# Patient Record
Sex: Female | Born: 1995 | Race: White | Hispanic: No | Marital: Single | Smoking: Never smoker
Health system: Southern US, Community
[De-identification: ages and names within clinical notes are randomized; demographics above are authoritative.]

## PROBLEM LIST (undated history)

## (undated) DIAGNOSIS — Q6589 Other specified congenital deformities of hip: Secondary | ICD-10-CM

## (undated) DIAGNOSIS — R197 Diarrhea, unspecified: Secondary | ICD-10-CM

## (undated) DIAGNOSIS — E739 Lactose intolerance, unspecified: Secondary | ICD-10-CM

## (undated) DIAGNOSIS — K219 Gastro-esophageal reflux disease without esophagitis: Secondary | ICD-10-CM

## (undated) DIAGNOSIS — G44209 Tension-type headache, unspecified, not intractable: Secondary | ICD-10-CM

## (undated) DIAGNOSIS — Z91010 Allergy to peanuts: Secondary | ICD-10-CM

## (undated) HISTORY — DX: Gastro-esophageal reflux disease without esophagitis: K21.9

## (undated) HISTORY — DX: Tension-type headache, unspecified, not intractable: G44.209

## (undated) HISTORY — DX: Lactose intolerance, unspecified: E73.9

## (undated) HISTORY — DX: Allergy to peanuts: Z91.010

## (undated) HISTORY — DX: Diarrhea, unspecified: R19.7

## (undated) HISTORY — DX: Other specified congenital deformities of hip: Q65.89

---
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
## 2014-03-19 DIAGNOSIS — R197 Diarrhea, unspecified: Secondary | ICD-10-CM

## 2014-03-19 HISTORY — DX: Diarrhea, unspecified: R19.7

## 2014-06-09 IMAGING — RF DG CHOLANGIOGRAM OPERATIVE
1 series · 5 of 5 positions shown · non-contrast
Comparison: None.

CLINICAL DATA: Cholecystectomy.

EXAM:
INTRAOPERATIVE CHOLANGIOGRAM
TECHNIQUE: Cholangiographic images from the C-arm fluoroscopic device were
submitted for interpretation post-operatively. Please see the
procedural report for the amount of contrast and the fluoroscopy
time utilized.

[Series 1: run · 2 acquisitions, 5 frames shown]
[im 1/2]
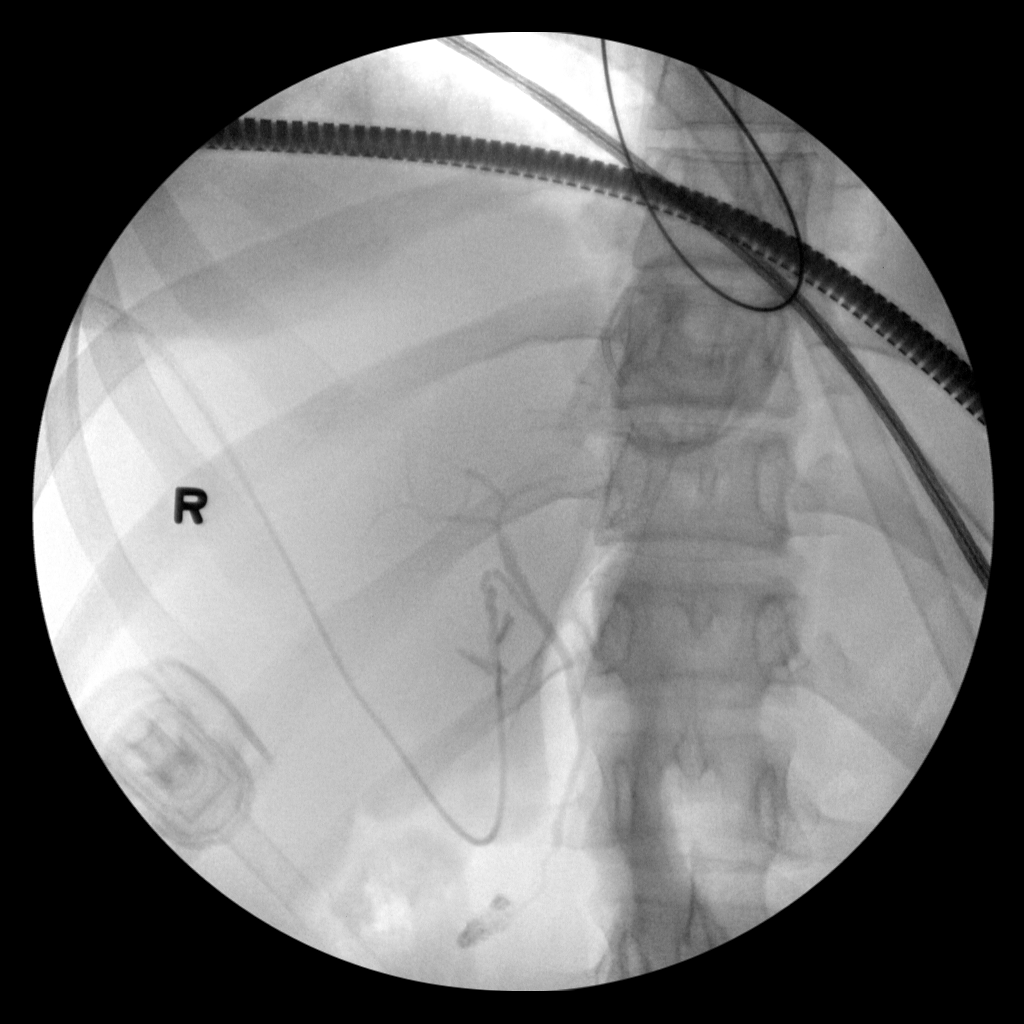
[im 1/2]
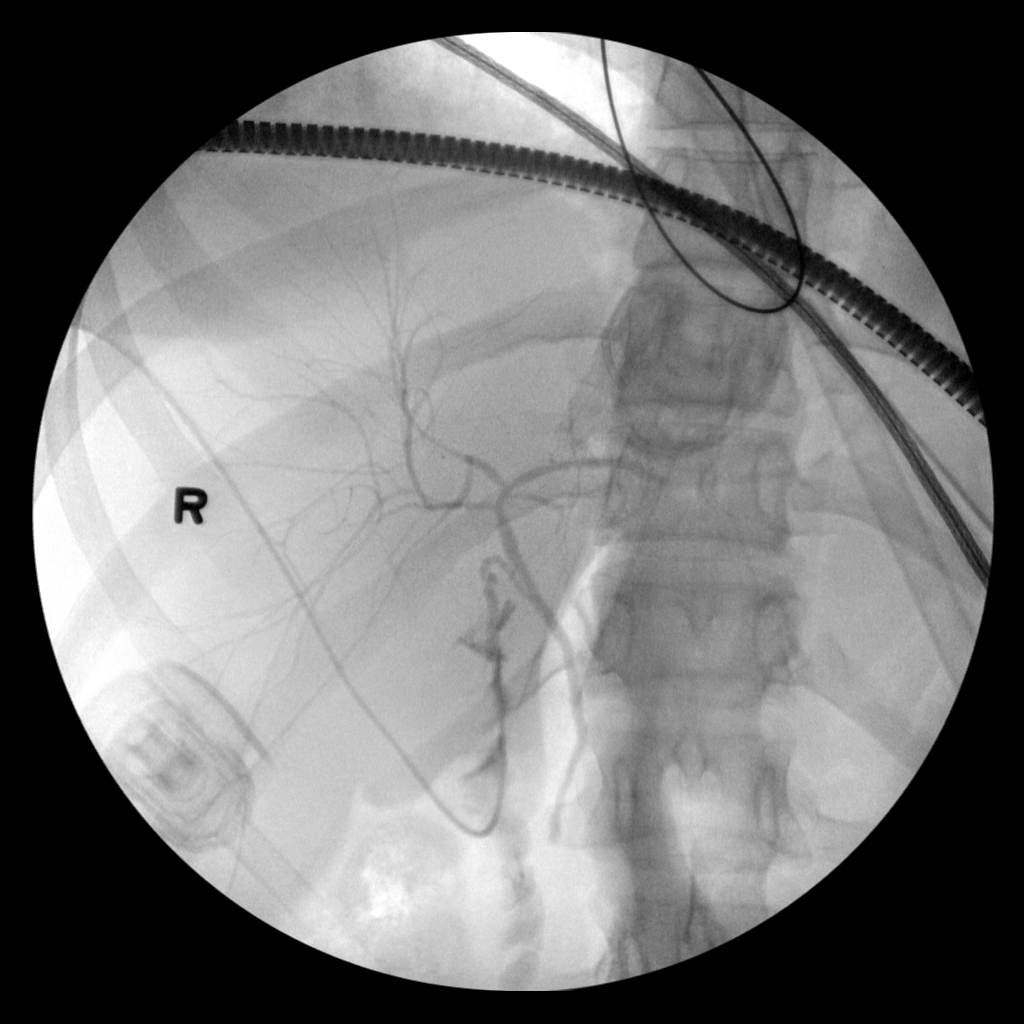
[im 1/2]
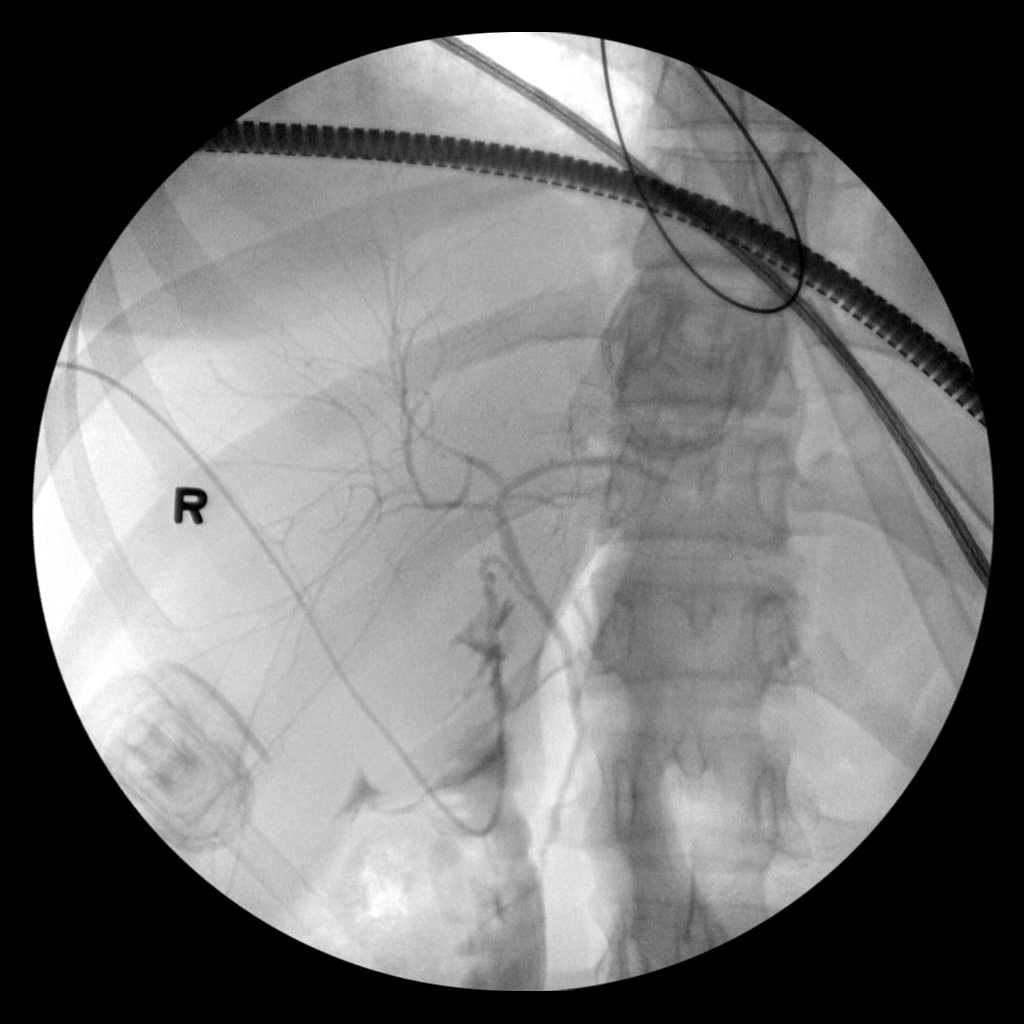
[im 1/2]
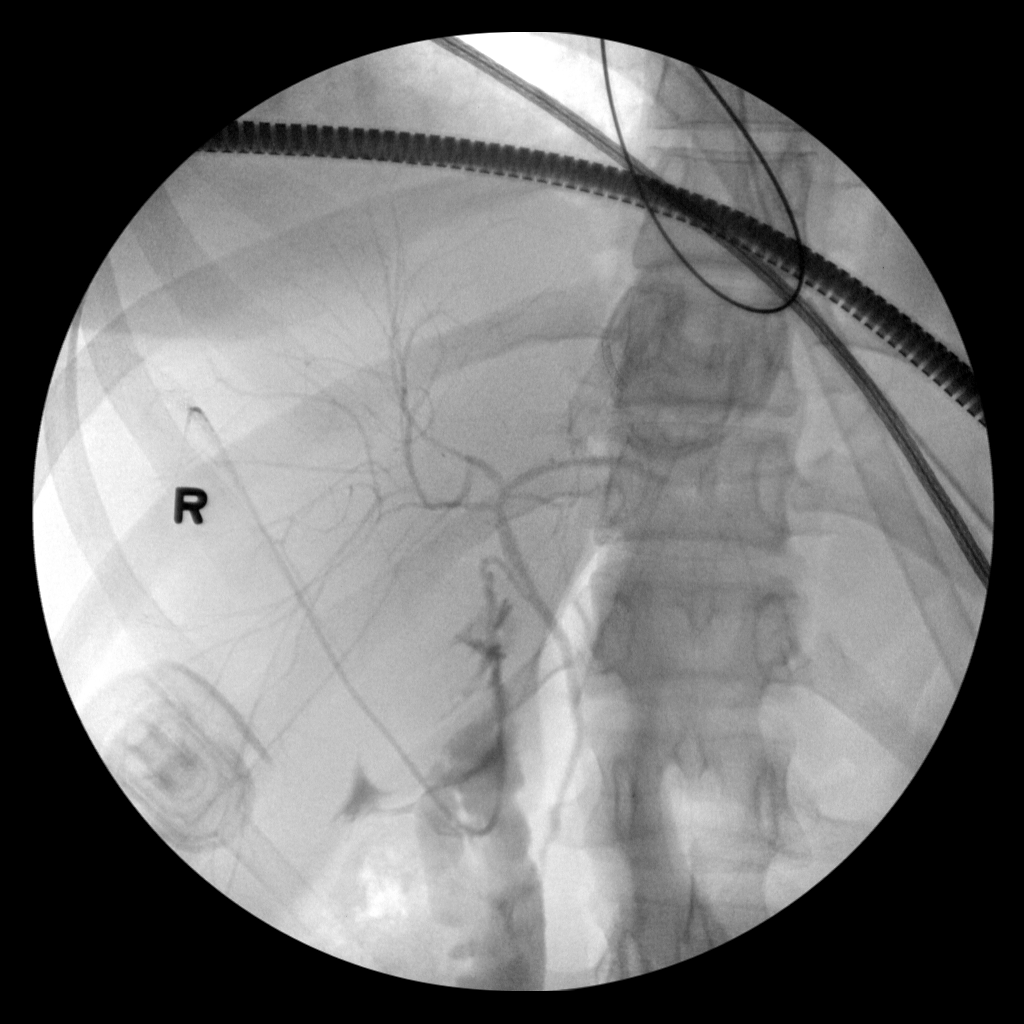
[im 2/2]
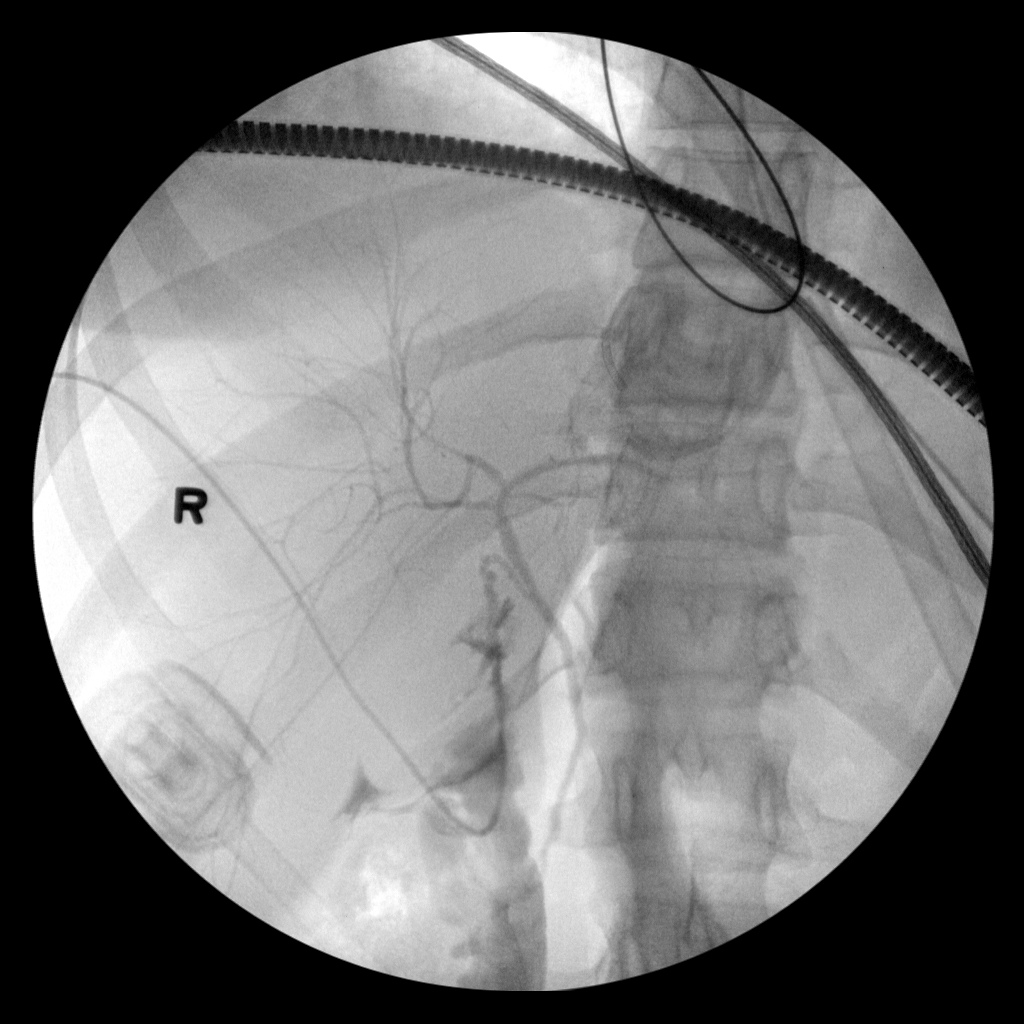

[5 of 5 positions shown; findings below may reference images not displayed]

FINDINGS: The intrahepatic and extrahepatic biliary system is well opacified.
The biliary system is non dilated. No evidence for filling defects
or stones.
IMPRESSION: Normal intraoperative cholangiogram. No evidence for an obstructing
lesion or stone.

## 2014-09-20 ENCOUNTER — Encounter: Payer: Self-pay | Admitting: Family Medicine

## 2014-10-18 ENCOUNTER — Encounter: Payer: Self-pay | Admitting: Family Medicine

## 2014-10-18 NOTE — Progress Notes (Deleted)
OFFICE VISIT  10/18/2014   CC: No chief complaint on file.  HPI:    Patient is a 19 y.o. Caucasian female who presents for ***  Past Medical History  Diagnosis Date  . GERD (gastroesophageal reflux disease)     Makes dietary adjustments + takes PPI  . Muscle contraction headache syndrome     No signif s/s of migraines  . Lactose intolerance     lactaid causes dry heaves: she avoids dairy  . Hip dysplasia     per old records  . Food allergy, peanut     ? seafood allergy? old records incomplete on this point.  . Family history of anesthesia complication     headache, n&v-mom  . Diarrhea 2016    ?post cholecystectomy syndrome?--her GI MD rx'd colestid 1g qhs 09/2014    Past Surgical History  Procedure Laterality Date  . Myringotomy  1998    tubes- bilateral  . Cholecystectomy N/A 05/26/2013    Procedure: LAPAROSCOPIC CHOLECYSTECTOMY WITH INTRAOPERATIVE CHOLANGIOGRAM;  Surgeon: Clovis Pu. Cornett, MD;  Location: [facility] OR;  Service: General;  Laterality: N/A;    Outpatient Prescriptions Prior to Visit  Medication Sig Dispense Refill  . etonogestrel-ethinyl estradiol (NUVARING) 0.12-0.015 MG/24HR vaginal ring Place 1 each vaginally every 28 (twenty-eight) days. Insert vaginally and leave in place for 3 consecutive weeks, then remove for 1 week.    . pantoprazole (PROTONIX) 40 MG tablet Take 1 tablet (40 mg total) by mouth daily. 30 tablet 3   No facility-administered medications prior to visit.    Allergies  Allergen Reactions  . Lactose Intolerance (Gi)     ROS As per HPI  PE: There were no vitals taken for this visit. ***  LABS:    Chemistry      Component Value Date/Time   NA 138 05/18/2013 0829   K 3.7 05/18/2013 0829   CL 103 05/18/2013 0829   CO2 22 05/18/2013 0829   BUN 10 05/18/2013 0829   CREATININE 0.65 05/18/2013 0829      Component Value Date/Time   CALCIUM 8.8 05/18/2013 0829   ALKPHOS 42 05/18/2013 0829   AST 19 05/18/2013 0829   ALT 26  05/18/2013 0829   BILITOT 0.4 05/18/2013 0829    Glucose 128 on 05/18/13 (???fasting)  Lab Results  Component Value Date   WBC 6.6 05/18/2013   HGB 12.7 05/18/2013   HCT 36.6 05/18/2013   MCV 89.1 05/18/2013   PLT 244 05/18/2013     IMPRESSION AND PLAN:  No problem-specific assessment & plan notes found for this encounter.   FOLLOW UP: No Follow-up on file.

## 2014-12-08 ENCOUNTER — Encounter: Payer: Self-pay | Admitting: Family Medicine

## 2015-03-02 ENCOUNTER — Encounter: Payer: Self-pay | Admitting: Podiatry

## 2015-03-02 ENCOUNTER — Ambulatory Visit (INDEPENDENT_AMBULATORY_CARE_PROVIDER_SITE_OTHER): Payer: BLUE CROSS/BLUE SHIELD | Admitting: Podiatry

## 2015-03-02 VITALS — BP 117/76 | HR 94 | Resp 12

## 2015-03-02 DIAGNOSIS — L600 Ingrowing nail: Secondary | ICD-10-CM | POA: Diagnosis not present

## 2015-03-02 MED ORDER — OXYCODONE-ACETAMINOPHEN 5-325 MG PO TABS: 1.0000 | ORAL_TABLET | ORAL | Status: DC | PRN

## 2015-03-02 NOTE — Patient Instructions (Signed)
Today after multiple attempts of local anesthetics, your toe still was uncomfortable and surgery was deferred I prescribed Percocet for pain control and I want you to take one Percocet one hour prior to years that schedule visit and do not drive We will again attempt local anesthesia

## 2015-03-02 NOTE — Progress Notes (Signed)
   Subjective:    Patient ID: Stacey Glass, female    DOB: 1995-05-10, 19 y.o.   MRN: [id number]  HPI   Patient presents today complaining of painful hallux toenails left  more painful than the right. Patient describes long history (multiple years) of discomfort in the margins of the toenails and has been able to trim the nails out until the past 2 days when the left hallux toenail became extremely uncomfortable. Patient denies any previous infection or with additional care for this problem.  Review of Systems  Skin: Positive for color change.       Objective:   Physical Exam  Pleasant orientated 3 patient is extremely anxious at presentation today and throughout the entire visit  Objective: Vascular: No peripheral edema bilaterally DP and PT pulses 2/4 bilaterally Capillary reflex immediate bilaterally  Neurological: Sensation to 10 g monofilament wire intact 5/5 bilaterally Vibratory sensation intact bilaterally Ankle reflex equal and reactive bilaterally  Dermatological: Texture and turgor within normal limits bilaterally The medial and lateral borders of the right and left hallux nails are incurvated Local erythema and edema along the medial lateral borders of the left hallux nail. There is no pus noted in the left hallux nail  Musculoskeletal: No deformities noted       Assessment & Plan:   Assessment: Satisfactory neurovascular status Ingrowing medial lateral borders of the left and right hallux nails Low-grade onychia left hallux nail  Plan: I discussed treatment options with patient today including permanent nail removal. Patient did verbally consent to local anesthetic and permanent nail removal of the medial lateral borders of the left hallux toenail. The left hallux was initially block with 3 mL of 50-50 mixture of 2% plain Xylocaine and 0.5% plain Marcaine. After approximately 15-20 minutes patient still was not numb and additional 2 mL oriented more  distal to the initial block utilizing the same mixture. Approximately 20 minutes later the patient still stated that she was not numb. The medial and lateral nail folds were infiltrated through a distal approach using an  additional 1/2 cc of the same solution.  After waiting approximately  20 minutes the patient patient still stated that she was not numb and could not tolerate the procedure.  At this time I recommended to patient that we defer on surgery and give patient a loading dose of Percocet 5/325 one hour prior to next visit. Patient early consented to this  Also, I offered patient antibiotics to treat a low-grade onychia and left hallux nail patient declined antibiotics  Rx Percocet 5/325 #8 take 1 every 4 hours as needed for pain On the after visit summary I wrote and struck to patient to take one Percocet one hour prior to next scheduled visit and do not drive  Reappoint at patient's request for repeat phenol matricectomy the medial lateral borders the left hallux nail

## 2015-03-08 ENCOUNTER — Ambulatory Visit (INDEPENDENT_AMBULATORY_CARE_PROVIDER_SITE_OTHER): Payer: BLUE CROSS/BLUE SHIELD | Admitting: Podiatry

## 2015-03-08 ENCOUNTER — Encounter: Payer: Self-pay | Admitting: Podiatry

## 2015-03-08 DIAGNOSIS — L600 Ingrowing nail: Secondary | ICD-10-CM

## 2015-03-08 NOTE — Patient Instructions (Signed)
   Today I numbed your toe with Carbocaine which provided complete anesthesia If you need anything for pain control afterwards a rather use over-the-counter ibuprofen Return as needed if you have any future concerns  ANTIBACTERIAL SOAP INSTRUCTIONS  THE DAY AFTER PROCEDURE   For the first dressing change (soak) Place 3-4 drops of antibacterial liquid soap in a quart of warm tap water.  Submerge foot into water for 2 minutes.  If bandage was applied after your procedure, leave on to allow for easy lift off, then remove and continue with soak for the remaining time.  Next, blot area dry with a soft cloth and apply antibiotic ointment such as polysporin, neosporin, or triple antibiotic ointment.  You may then switch to the instructions below    Shower as usual. Before getting out, place a drop of antibacterial liquid soap (Dial) on a wet, clean washcloth.  Gently wipe washcloth over affected area.  Afterward, rinse the area with warm water.  Blot the area dry with a soft cloth and cover with antibiotic ointment (neosporin, polysporin, bacitracin) and band aid or gauze and tape

## 2015-03-08 NOTE — Progress Notes (Signed)
Patient ID: Stacey Glass, female   DOB: December 09, 1995, 19 y.o.   MRN: [id number]  Subjective: This patient presents for scheduled visit for removal of the medial lateral borders: Of the left hallux toenail. On the initial visit of 03/02/2015 anesthesia was not achieved and surgery deferred until today. Patient presents taking one Percocet 5/325 one hour prior to presentation  Objective: Patient appears more relaxed today DP and PT pulses 2/4 bilaterally The medial lateral borders the left hallux toenail incurvated and tender to direct palpation. There is mild edema along the lateral border of the left hallux toenail  Assessment: Ingrowing medial lateral borders of the left hallux toenail  Plan: The left hallux block with 3 mL of 2% Carbocaine. Anesthesia was obtained without any difficulty. The left hallux was painted with Betadine and exsanguinated. The medial lateral borders the left hallux toenail were excised and a phenol matricectomy performed. The wounds were washed with alcohol and an antibiotic compression dressing was applied. Tourniquet was released and spontaneous capillary fill time noted in left hallux. Patient tolerated procedure without any difficulty  Postoperative oral reconstruction provided. Postoperative medication suggested ibuprofen rather than Percocet that was previously prescribed  Reappoint if patient has a future concerns

## 2015-03-24 ENCOUNTER — Ambulatory Visit: Admit: 2015-03-24 | Discharge: 2015-03-24 | Payer: BLUE CROSS/BLUE SHIELD | Attending: Family

## 2015-03-24 DIAGNOSIS — J329 Chronic sinusitis, unspecified: Secondary | ICD-10-CM

## 2015-03-24 NOTE — Patient Instructions (Addendum)
Sinusitis: Care Instructions  Your Care Instructions     Sinusitis is an infection of the lining of the sinus cavities in your head. Sinusitis often follows a cold. It causes pain and pressure in your head and face.  In most cases, sinusitis gets better on its own in 1 to 2 weeks. But some mild symptoms may last for several weeks. Sometimes antibiotics are needed.  Follow-up care is a key part of your treatment and safety. Be sure to make and go to all appointments, and call your doctor if you are having problems. It's also a good idea to know your test results and keep a list of the medicines you take.  How can you care for yourself at home?  ?? Take an over-the-counter pain medicine, such as acetaminophen (Tylenol), ibuprofen (Advil, Motrin), or naproxen (Aleve). Read and follow all instructions on the label.  ?? If the doctor prescribed antibiotics, take them as directed. Do not stop taking them just because you feel better. You need to take the full course of antibiotics.  ?? Be careful when taking over-the-counter cold or flu medicines and Tylenol at the same time. Many of these medicines have acetaminophen, which is Tylenol. Read the labels to make sure that you are not taking more than the recommended dose. Too much acetaminophen (Tylenol) can be harmful.  ?? Breathe warm, moist air from a steamy shower, a hot bath, or a sink filled with hot water. Avoid cold, dry air. Using a humidifier in your home may help. Follow the directions for cleaning the machine.  ?? Use saline (saltwater) nasal washes to help keep your nasal passages open and wash out mucus and bacteria. You can buy saline nose drops at a grocery store or drugstore. Or you can make your own at home by adding 1 teaspoon of salt and 1 teaspoon of baking soda to 2 cups of distilled water. If you make your own, fill a bulb syringe with the solution, insert the tip into your nostril, and squeeze gently. Blow your nose.  ?? Put a hot, wet towel or a warm  gel pack on your face 3 or 4 times a day for 5 to 10 minutes each time.  ?? Try a decongestant nasal spray like oxymetazoline (Afrin). Do not use it for more than 3 days in a row. Using it for more than 3 days can make your congestion worse.  When should you call for help?  Call your doctor now or seek immediate medical care if:  ?? You have new or worse swelling or redness in your face or around your eyes.  ?? You have a new or higher fever.  Watch closely for changes in your health, and be sure to contact your doctor if:  ?? You have new or worse facial pain.  ?? The mucus from your nose becomes thicker (like pus) or has new blood in it.  ?? You are not getting better as expected.  Where can you learn more?  Go to [email or web address] and sign in to your MyChart account. Enter I933 in the Search Health Information box to learn more about ???Sinusitis: Care Instructions.???    If you do not have an account, please click on the ???Sign Up Now??? link.  ?? 2006-2016 [employer]. Care instructions adapted under license by [facility]. This care instruction is for use with your licensed healthcare professional. If you have questions about a medical condition or this instruction, always ask your healthcare professional. [employer],   Incorpor[employer] disclaims any warranty or liability for your use of this information.  Content Version: 11.0.578772; Current as of: February 05, 2014        Saline Nasal Washes: Care Instructions  Your Care Instructions  Saline nasal washes help keep the nasal passages open by washing out thick or dried mucus. This simple remedy can help relieve symptoms of allergies, sinusitis, and colds. It also can make the nose feel more comfortable by keeping the mucous membranes moist. You may notice a little burning sensation in your nose the first few times you use the solution, but this usually gets better in a few days.  Follow-up care is a key part of your treatment and safety. Be  sure to make and go to all appointments, and call your doctor if you are having problems. It's also a good idea to know your test results and keep a list of the medicines you take.  How can you care for yourself at home?  ?? You can buy premixed saline solution in a squeeze bottle or other sinus rinse products at a drugstore. Read and follow the instructions on the label.  ?? You also can make your own saline solution by adding 1 teaspoon of salt and 1 teaspoon of baking soda to 2 cups of distilled water.  ?? If you use a homemade solution, pour a small amount into a clean bowl. Using a rubber bulb syringe, squeeze the syringe and place the tip in the salt water. Pull a small amount of the salt water into the syringe by relaxing your hand.  ?? Sit down with your head tilted slightly back. Do not lie down. Put the tip of the bulb syringe or the squeeze bottle a little way into one of your nostrils. Gently drip or squirt a few drops into the nostril. Repeat with the other nostril. Some sneezing and gagging are normal at first.  ?? Gently blow your nose.  ?? Wipe the syringe or bottle tip clean after each use.  ?? Repeat this 2 or 3 times a day.  ?? Use nasal washes gently if you have nosebleeds often.  When should you call for help?  Watch closely for changes in your health, and be sure to contact your doctor if:  ?? You often get nosebleeds.  ?? You have problems doing the nasal washes.  Where can you learn more?  Go to [email or web address] and sign in to your MyChart account. Enter B784 in the Search Health Information box to learn more about ???Saline Nasal Washes: Care Instructions.???    If you do not have an account, please click on the ???Sign Up Now??? link.  ?? 2006-2016 [employer]. Care instructions adapted under license by  Health. This care instruction is for use with your licensed healthcare professional. If you have questions about a medical condition or this instruction, always ask  your healthcare professional. [employer], [employer] disclaims any warranty or liability for your use of this information.  Content Version: 11.0.578772; Current as of: March 10, 2014

## 2015-03-24 NOTE — Progress Notes (Signed)
Subjective:      Patient ID: Sherry Chase is a 20 y.o. female.    URI    This is a new problem. The current episode started in the past 7 days. The problem has been gradually worsening. Associated symptoms include coughing, ear pain, rhinorrhea, sinus pain, sneezing and a sore throat. Treatments tried: Dayquil; Mucinex. The treatment provided mild relief.       Review of Systems   HENT: Positive for ear pain, postnasal drip, rhinorrhea, sneezing and sore throat.    Respiratory: Positive for cough.    Cardiovascular: Negative.    Musculoskeletal: Negative.    Skin: Negative.        Objective:   Physical Exam   Constitutional: She is oriented to person, place, and time. She appears well-developed.   HENT:   Head: Normocephalic and atraumatic.   Right Ear: Tympanic membrane, external ear and ear canal normal.   Left Ear: Tympanic membrane, external ear and ear canal normal.   Nose: Mucosal edema and rhinorrhea present. Right sinus exhibits maxillary sinus tenderness. Left sinus exhibits maxillary sinus tenderness.   Mouth/Throat: Uvula is midline, oropharynx is clear and moist and mucous membranes are normal.   Eyes: Conjunctivae, EOM and lids are normal. Pupils are equal, round, and reactive to light.   Neck: Trachea normal and normal range of motion.   Cardiovascular: Normal rate, regular rhythm, normal heart sounds and normal pulses.    Pulmonary/Chest: Effort normal and breath sounds normal.   Musculoskeletal: Normal range of motion.   Neurological: She is alert and oriented to person, place, and time.   Skin: Skin is warm, dry and intact.     Vitals:    03/24/15 1450   BP: 122/70   Pulse: 92   Resp: 16   Temp: 97.8 ??F (36.6 ??C)   SpO2: 98%     I have reviewed pertinent family and social history.    Assessment:      1. Sinusitis, unspecified chronicity, unspecified location  fluticasone (FLONASE) 50 MCG/ACT nasal spray    cefUROXime (CEFTIN) 250 MG tablet           Plan:      Stay hydrated and drink plenty of  fluids.    May use OTC throat lozenges and/or OTC cough suppressant.    Encouraged patient to complete full course of antibiotic and to return to clinic if no improvement in 3-4 days.  May use OTC acetaminophen or ibuprofen prn pain/fever.  Recommend Mucinex, Netty Pot  Recommend Flonase and/or Antihistamine daily.    No Known Allergies    Current Outpatient Prescriptions   Medication Sig Dispense Refill   ??? colestipol (COLESTID) 1 G tablet TK 1 T PO Q 12 H  2   ??? NUVARING 0.12-0.015 MG/24HR vaginal ring   3   ??? fluticasone (FLONASE) 50 MCG/ACT nasal spray 2 sprays by Nasal route daily 1 Bottle 0   ??? cefUROXime (CEFTIN) 250 MG tablet Take 1 tablet by mouth 2 times daily for 10 days Take with food. 20 tablet 0     No current facility-administered medications for this visit.

## 2015-05-16 ENCOUNTER — Telehealth: Payer: Self-pay | Admitting: Podiatry

## 2015-05-16 NOTE — Telephone Encounter (Signed)
Left voicemail for pt to call to schedule appt.

## 2015-05-25 ENCOUNTER — Encounter: Payer: Self-pay | Admitting: Podiatry

## 2015-05-25 ENCOUNTER — Ambulatory Visit (INDEPENDENT_AMBULATORY_CARE_PROVIDER_SITE_OTHER): Payer: BLUE CROSS/BLUE SHIELD | Admitting: Podiatry

## 2015-05-25 VITALS — BP 115/78 | HR 80 | Resp 12

## 2015-05-25 DIAGNOSIS — L600 Ingrowing nail: Secondary | ICD-10-CM | POA: Diagnosis not present

## 2015-05-25 NOTE — Patient Instructions (Signed)
ANTIBACTERIAL SOAP INSTRUCTIONS  THE DAY AFTER PROCEDURE   For the first dressing change (soak) Place 3-4 drops of antibacterial liquid soap in a quart of warm tap water.  Submerge foot into water for 2 minutes.  If bandage was applied after your procedure, leave on to allow for easy lift off, then remove and continue with soak for the remaining time.  Next, blot area dry with a soft cloth and apply antibiotic ointment such as polysporin, neosporin, or triple antibiotic ointment.  You may then switch to the instructions below    Shower as usual. Before getting out, place a drop of antibacterial liquid soap (Dial) on a wet, clean washcloth.  Gently wipe washcloth over affected area.  Afterward, rinse the area with warm water.  Blot the area dry with a soft cloth and cover with antibiotic ointment (neosporin, polysporin, bacitracin) and band aid or gauze and tape     

## 2015-05-25 NOTE — Progress Notes (Signed)
   Subjective:    Patient ID: Stacey Glass, female    DOB: 01/26/1996, 20 y.o.   MRN: [id number]  HPI    This patient presents today complaining of a painful right hallux toenail, medial lateral borders for 4 months with gradual increasing discomfort with direct shoe pressure and standing and walking. Patient has had a similar problem in the left hallux toenail which was treated with permanent nail removal of the medial lateral borders. Patient is requesting that the medial lateral borders of the right hallux toenail removed for permanent correction. Patient has presented with a loading dose of Percocet 5/325 one hour prior to this visit, with her boyfriend present in the treatment room.  Review of Systems  Skin: Positive for color change.  Neurological: Positive for dizziness.  All other systems reviewed and are negative.      Objective:   Physical Exam  Patient is orientated 3  Vascular: DP and PT pulses 2/4 bilaterally Capillary refill is immediate bilaterally  Neurological: Deferred   Dermatological: The medial and lateral borders of the right hallux toenail are incurvated with callused nail grooves and tenderness to palpation on the medial and lateral borders the right hallux toenail  Musculoskeletal: No deformities noted       Assessment & Plan:   Assessment: Ingrowing medial lateral borders the right hallux toenail  Plan: Today I discussed the findings of examination and described treatment for ingrowing toenails including repetitive debridement, temporary movement of the margins or permanent nail removal of the margins. Patient verbally consents to permanent nail removal of the margins of the right hallux toenail with her life and present in the treatment room  The right hallux was blocked with 3 mL of 2% plain Carbocaine. The toe is prepped with Betadine and exsanguinated. The medial and lateral borders of the right hallux toenail were excised and a phenol  matricectomy performed. The wounds were flushed with alcohol and an antibiotic compression dressing was applied. The tourniquet was released and spontaneous capillary fill time noted in the right hallux. Patient tolerated procedure without any difficulty  Postoperative oral instructions provided.  Reappoint at patient's request

## 2015-07-28 ENCOUNTER — Encounter: Payer: Self-pay | Admitting: Podiatry

## 2015-07-28 ENCOUNTER — Ambulatory Visit (INDEPENDENT_AMBULATORY_CARE_PROVIDER_SITE_OTHER): Payer: BLUE CROSS/BLUE SHIELD

## 2015-07-28 ENCOUNTER — Ambulatory Visit (INDEPENDENT_AMBULATORY_CARE_PROVIDER_SITE_OTHER): Payer: BLUE CROSS/BLUE SHIELD | Admitting: Podiatry

## 2015-07-28 VITALS — BP 106/64 | HR 84 | Resp 16

## 2015-07-28 DIAGNOSIS — M79672 Pain in left foot: Secondary | ICD-10-CM | POA: Diagnosis not present

## 2015-07-28 DIAGNOSIS — M722 Plantar fascial fibromatosis: Secondary | ICD-10-CM | POA: Diagnosis not present

## 2015-07-28 MED ORDER — TRIAMCINOLONE ACETONIDE 10 MG/ML IJ SUSP
10.0000 mg | Freq: Once | INTRAMUSCULAR | Status: AC
  Administered 2015-07-28: 10 mg

## 2015-07-28 NOTE — Patient Instructions (Signed)

## 2015-07-28 NOTE — Progress Notes (Signed)
Subjective:     Patient ID: Stacey Glass, female   DOB: 1995/05/07, 20 y.o.   MRN: [id number]  HPI patient presents with a lot of pain on the inside of the left ankle stating that it's been present for several months and she's been trying to get more active and exercise   Review of Systems     Objective:   Physical Exam Neurovascular status intact muscle strength adequate with inflammation and pain of the left posterior tib tendon as it comes under the medial malleolus with no indication of tendon dysfunction currently    Assessment:     Tendinitis of the left posterior tibial tendon    Plan:     H&P x-ray and condition reviewed with patient. I've recommended careful steroid injection explaining risk of rupture and she wants it done and I injected the sheath of the posterior tib tendon 3 mg Dexon some Kenalog 5 mg Xylocaine and dispensed fascial brace to lift the arch. Wrote her a prescription for orthotics and she'll be seen back  X-ray report indicates mild depression of the arch but no other arthritic or stress fracture or fracture pathology

## 2015-08-11 ENCOUNTER — Encounter: Payer: Self-pay | Admitting: Podiatry

## 2015-08-11 ENCOUNTER — Ambulatory Visit (INDEPENDENT_AMBULATORY_CARE_PROVIDER_SITE_OTHER): Payer: BLUE CROSS/BLUE SHIELD | Admitting: Podiatry

## 2015-08-11 DIAGNOSIS — M779 Enthesopathy, unspecified: Secondary | ICD-10-CM | POA: Diagnosis not present

## 2015-08-11 MED ORDER — DICLOFENAC SODIUM 75 MG PO TBEC: 75.0000 mg | DELAYED_RELEASE_TABLET | Freq: Two times a day (BID) | ORAL | Status: DC

## 2015-08-11 NOTE — Progress Notes (Signed)
Subjective:     Patient ID: Stacey Glass, female   DOB: 07-Apr-1995, 20 y.o.   MRN: [id number]  HPI patient presents stating I'm doing some better but I'm still having some discomfort around my left inside foot    Review of Systems     Objective:   Physical Exam Neurovascular status intact muscle strength adequate with discomfort in the medial side the left foot around the posterior tibial insertion to the navicular with moderate discomfort versus severe pain of last visit with reduced edema    Assessment:     Improving tendinitis posterior tibial tendon insertion left    Plan:     Advised on ice therapy and at this time she states she cannot do her regular activities I didn't put her into a air fracture walker to completely immobilize for several weeks and stress to her the importance of getting her orthotics from her father. Reappoint 5 weeks or earlier if needed

## 2015-09-15 ENCOUNTER — Ambulatory Visit: Payer: BLUE CROSS/BLUE SHIELD | Admitting: Podiatry

## 2016-03-16 ENCOUNTER — Encounter: Payer: Self-pay | Admitting: Family Medicine

## 2016-03-16 ENCOUNTER — Ambulatory Visit (INDEPENDENT_AMBULATORY_CARE_PROVIDER_SITE_OTHER): Payer: BLUE CROSS/BLUE SHIELD | Admitting: Family Medicine

## 2016-03-16 VITALS — BP 118/77 | HR 79 | Temp 98.2°F | Resp 16 | Wt 222.0 lb

## 2016-03-16 DIAGNOSIS — R109 Unspecified abdominal pain: Secondary | ICD-10-CM

## 2016-03-16 LAB — POCT URINALYSIS DIPSTICK
Bilirubin, UA: NEGATIVE
Blood, UA: NEGATIVE
Glucose, UA: NEGATIVE
KETONES UA: NEGATIVE
Nitrite, UA: NEGATIVE
PH UA: 6
PROTEIN UA: NEGATIVE
SPEC GRAV UA: 1.015
Urobilinogen, UA: 0.2

## 2016-03-16 MED ORDER — METRONIDAZOLE 500 MG PO TABS: 500.0000 mg | ORAL_TABLET | Freq: Three times a day (TID) | ORAL | 0 refills | Status: AC

## 2016-03-16 MED ORDER — CIPROFLOXACIN HCL 500 MG PO TABS: 500.0000 mg | ORAL_TABLET | Freq: Two times a day (BID) | ORAL | 0 refills | Status: AC

## 2016-03-16 NOTE — Progress Notes (Signed)
OFFICE VISIT  03/16/2016   CC:  Chief Complaint  Patient presents with  . Abdominal Pain    LRQ x 1 week   HPI:    Patient is a 20 y.o. Caucasian female who presents for abdominal pain. Onset about a week ago, mild/mod in intensity, located in right mid abdomen area and sometimes also feels R LB pain on/off in last day or so.  Dull, achy "cramp" pain is how she describes the pain.  Initially 1-2 times a day, now feels it multiple times per day.  No fever.  No n/v. Her appetite is normal.  No constip/diarrhea, no blood in stool.  Vag d/c is a bit more yellow but no change otherwise.   No hx of STI.  Has had same partner x 3 yrs.  Took 2 pregnancy tests last night and both were neg. Says she is prone to UTIs.  Her UTI sx's usually are classic but she has none of these sx's with this pain. No gross hematuria.   Nothing really makes it better.  It is worse laying down.  Past Medical History:  Diagnosis Date  . Diarrhea 2016   post cholecystectomy diarrhea--Dr. Jason FilaBray (GI MD) started colestid and this helped, as did ginger-ale/ginger tabs prn  . Food allergy, peanut    ? seafood allergy? old records incomplete on this point.  Marland Kitchen. GERD (gastroesophageal reflux disease)    Makes dietary adjustments + takes PPI  . Hip dysplasia    per old records  . Lactose intolerance    lactaid causes dry heaves: she avoids dairy  . Muscle contraction headache syndrome    No signif s/s of migraines    Past Surgical History:  Procedure Laterality Date  . CHOLECYSTECTOMY N/A 05/26/2013   Procedure: LAPAROSCOPIC CHOLECYSTECTOMY WITH INTRAOPERATIVE CHOLANGIOGRAM;  Surgeon: Clovis Puhomas A. Cornett, MD;  Location: [facility] OR;  Service: General;  Laterality: N/A;  . MYRINGOTOMY  1998   tubes- bilateral    Outpatient Medications Prior to Visit  Medication Sig Dispense Refill  . diclofenac (VOLTAREN) 75 MG EC tablet Take 1 tablet (75 mg total) by mouth 2 (two) times daily. (Patient not taking: Reported on 03/16/2016)  50 tablet 2  . etonogestrel-ethinyl estradiol (NUVARING) 0.12-0.015 MG/24HR vaginal ring Place 1 each vaginally every 28 (twenty-eight) days. Insert vaginally and leave in place for 3 consecutive weeks, then remove for 1 week.    . pantoprazole (PROTONIX) 40 MG tablet Take 1 tablet (40 mg total) by mouth daily. 30 tablet 3   No facility-administered medications prior to visit.     Allergies  Allergen Reactions  . Lactose Intolerance (Gi)     ROS As per HPI  PE: Blood pressure 118/77, pulse 79, temperature 98.2 F (36.8 C), temperature source Oral, resp. rate 16, weight 222 lb (100.7 kg), last menstrual period 04/20/2015, SpO2 98 %.  Pt examined with Wallace KellerHeather Kirby, CMA, as chaperone.  Gen: Alert, well appearing.  Patient is oriented to person, place, time, and situation. AFFECT: pleasant, lucid thought and speech. NWG:NFAOENT:Eyes: no injection, icteris, swelling, or exudate.  EOMI, PERRLA. Mouth: lips without lesion/swelling.  Oral mucosa pink and moist. Oropharynx without erythema, exudate, or swelling.  CV: RRR, no m/r/g.   LUNGS: CTA bilat, nonlabored resps, good aeration in all lung fields. ABD: soft, mild tenderness to palpation in right mid-to-upper abdomen, without guarding or rebound. ND, BS normal.  No hepatospenomegaly or mass.  No bruits. Back: mild TTP in right low back and in soft tissue below  ribs on right side.  LABS:  CC UA today showed trace LEU, otherwise normal  IMPRESSION AND PLAN:  Subacute right mid abdominal pain; fortunately, her abdominal exam is benign. No imaging indicated at this time. Will treat as atypical diverticulitis: cipro 500 mg bid x 14d and flagyl 500 mg tid x 14d. Will check CBC and CMET today. If not improving with this course of treatment, will likely need to proceed with imaging.  An After Visit Summary was printed and given to the patient.  FOLLOW UP: Return for 7-10 days f/u abd pain.  Signed:  Santiago BumpersPhil Shatona Andujar, MD            03/16/2016

## 2016-03-16 NOTE — Progress Notes (Signed)
Pre visit review using our clinic review tool, if applicable. No additional management support is needed unless otherwise documented below in the visit note. 

## 2016-03-20 LAB — COMPREHENSIVE METABOLIC PANEL
ALT: 36 U/L — ABNORMAL HIGH (ref 6–29)
AST: 16 U/L (ref 10–30)
Albumin: 4.4 g/dL (ref 3.6–5.1)
Alkaline Phosphatase: 61 U/L (ref 33–115)
BUN: 14 mg/dL (ref 7–25)
CALCIUM: 9.6 mg/dL (ref 8.6–10.2)
CHLORIDE: 104 mmol/L (ref 98–110)
CO2: 21 mmol/L (ref 20–31)
Creat: 0.79 mg/dL (ref 0.50–1.10)
GLUCOSE: 105 mg/dL — AB (ref 65–99)
POTASSIUM: 3.9 mmol/L (ref 3.5–5.3)
Sodium: 137 mmol/L (ref 135–146)
Total Bilirubin: 0.4 mg/dL (ref 0.2–1.2)
Total Protein: 6.8 g/dL (ref 6.1–8.1)

## 2016-03-20 LAB — CBC WITH DIFFERENTIAL/PLATELET

## 2016-03-27 ENCOUNTER — Ambulatory Visit: Payer: BLUE CROSS/BLUE SHIELD | Admitting: Family Medicine

## 2022-03-07 NOTE — Progress Notes (Signed)
This encounter was created in error - please disregard.
